# Patient Record
Sex: Male | Born: 1955 | Race: White | Hispanic: No | Marital: Married | Smoking: Never smoker
Health system: Southern US, Community
[De-identification: ages and names within clinical notes are randomized; demographics above are authoritative.]

## PROBLEM LIST (undated history)

## (undated) DIAGNOSIS — T7840XA Allergy, unspecified, initial encounter: Secondary | ICD-10-CM

## (undated) HISTORY — DX: Allergy, unspecified, initial encounter: T78.40XA

---

## 2004-07-28 ENCOUNTER — Ambulatory Visit: Payer: Self-pay | Admitting: Sports Medicine

## 2005-04-02 ENCOUNTER — Ambulatory Visit: Payer: Self-pay | Admitting: Sports Medicine

## 2005-04-16 ENCOUNTER — Ambulatory Visit: Payer: Self-pay | Admitting: Sports Medicine

## 2006-05-31 ENCOUNTER — Ambulatory Visit: Payer: Self-pay | Admitting: Sports Medicine

## 2007-01-18 ENCOUNTER — Ambulatory Visit: Payer: Self-pay | Admitting: Family Medicine

## 2007-01-18 DIAGNOSIS — J309 Allergic rhinitis, unspecified: Secondary | ICD-10-CM | POA: Insufficient documentation

## 2007-01-18 DIAGNOSIS — B009 Herpesviral infection, unspecified: Secondary | ICD-10-CM | POA: Insufficient documentation

## 2007-01-26 ENCOUNTER — Ambulatory Visit: Payer: Self-pay | Admitting: Family Medicine

## 2007-01-27 LAB — CONVERTED CEMR LAB
Cholesterol: 166 mg/dL (ref 0–200)
HDL: 46.8 mg/dL (ref >39.0)
LDL Cholesterol: 106 mg/dL — ABNORMAL HIGH (ref 0–99)
Total CHOL/HDL Ratio: 3.5

## 2007-01-30 ENCOUNTER — Encounter (INDEPENDENT_AMBULATORY_CARE_PROVIDER_SITE_OTHER): Payer: Self-pay | Admitting: Family Medicine

## 2007-01-30 ENCOUNTER — Telehealth (INDEPENDENT_AMBULATORY_CARE_PROVIDER_SITE_OTHER): Payer: Self-pay | Admitting: *Deleted

## 2007-08-16 ENCOUNTER — Ambulatory Visit: Payer: Self-pay | Admitting: Internal Medicine

## 2007-10-18 ENCOUNTER — Telehealth (INDEPENDENT_AMBULATORY_CARE_PROVIDER_SITE_OTHER): Payer: Self-pay | Admitting: *Deleted

## 2008-01-05 ENCOUNTER — Telehealth (INDEPENDENT_AMBULATORY_CARE_PROVIDER_SITE_OTHER): Payer: Self-pay | Admitting: *Deleted

## 2008-01-08 ENCOUNTER — Telehealth (INDEPENDENT_AMBULATORY_CARE_PROVIDER_SITE_OTHER): Payer: Self-pay | Admitting: *Deleted

## 2008-08-30 ENCOUNTER — Telehealth (INDEPENDENT_AMBULATORY_CARE_PROVIDER_SITE_OTHER): Payer: Self-pay | Admitting: *Deleted

## 2008-11-11 ENCOUNTER — Ambulatory Visit: Payer: Self-pay | Admitting: Sports Medicine

## 2009-03-26 ENCOUNTER — Telehealth (INDEPENDENT_AMBULATORY_CARE_PROVIDER_SITE_OTHER): Payer: Self-pay | Admitting: *Deleted

## 2009-04-02 ENCOUNTER — Ambulatory Visit: Payer: Self-pay | Admitting: Internal Medicine

## 2009-04-02 DIAGNOSIS — E785 Hyperlipidemia, unspecified: Secondary | ICD-10-CM | POA: Insufficient documentation

## 2009-04-02 DIAGNOSIS — N400 Enlarged prostate without lower urinary tract symptoms: Secondary | ICD-10-CM

## 2009-04-03 ENCOUNTER — Encounter (INDEPENDENT_AMBULATORY_CARE_PROVIDER_SITE_OTHER): Payer: Self-pay | Admitting: *Deleted

## 2009-04-09 ENCOUNTER — Ambulatory Visit: Payer: Self-pay | Admitting: Internal Medicine

## 2009-04-09 LAB — CONVERTED CEMR LAB
Blood in Urine, dipstick: NEGATIVE
Glucose, Urine, Semiquant: NEGATIVE
Nitrite: NEGATIVE
Protein, U semiquant: NEGATIVE
WBC Urine, dipstick: NEGATIVE
pH: 7.5

## 2009-04-14 ENCOUNTER — Encounter (INDEPENDENT_AMBULATORY_CARE_PROVIDER_SITE_OTHER): Payer: Self-pay | Admitting: *Deleted

## 2009-04-14 LAB — CONVERTED CEMR LAB
ALT: 26 units/L (ref 0–53)
Basophils Relative: 1.1 % (ref 0.0–3.0)
Bilirubin, Direct: 0.1 mg/dL (ref 0.0–0.3)
CO2: 30 meq/L (ref 19–32)
Chloride: 108 meq/L (ref 96–112)
Creatinine, Ser: 0.8 mg/dL (ref 0.4–1.5)
Eosinophils Relative: 3.9 % (ref 0.0–5.0)
Hemoglobin: 14.8 g/dL (ref 13.0–17.0)
Lymphocytes Relative: 37.3 % (ref 12.0–46.0)
MCHC: 34.3 g/dL (ref 30.0–36.0)
Monocytes Relative: 7.7 % (ref 3.0–12.0)
Neutro Abs: 2.2 10*3/uL (ref 1.4–7.7)
RBC: 4.59 M/uL (ref 4.22–5.81)
TSH: 4.44 microintl units/mL (ref 0.35–5.50)
Total CHOL/HDL Ratio: 3
Total Protein: 6.8 g/dL (ref 6.0–8.3)
Triglycerides: 51 mg/dL (ref 0.0–149.0)
WBC: 4.6 10*3/uL (ref 4.5–10.5)

## 2009-05-14 ENCOUNTER — Encounter (INDEPENDENT_AMBULATORY_CARE_PROVIDER_SITE_OTHER): Payer: Self-pay

## 2009-05-20 ENCOUNTER — Ambulatory Visit: Payer: Self-pay | Admitting: Internal Medicine

## 2009-06-06 ENCOUNTER — Ambulatory Visit: Payer: Self-pay | Admitting: Internal Medicine

## 2010-03-08 ENCOUNTER — Ambulatory Visit: Payer: Self-pay | Admitting: Interventional Radiology

## 2010-03-08 ENCOUNTER — Emergency Department (HOSPITAL_BASED_OUTPATIENT_CLINIC_OR_DEPARTMENT_OTHER): Admission: EM | Admit: 2010-03-08 | Discharge: 2010-03-08 | Payer: Self-pay | Admitting: Emergency Medicine

## 2010-04-28 ENCOUNTER — Ambulatory Visit: Payer: Self-pay | Admitting: Family Medicine

## 2010-04-28 ENCOUNTER — Encounter: Payer: Self-pay | Admitting: Family Medicine

## 2010-04-28 ENCOUNTER — Encounter
Admission: RE | Admit: 2010-04-28 | Discharge: 2010-04-28 | Payer: Self-pay | Source: Home / Self Care | Attending: Family Medicine | Admitting: Family Medicine

## 2010-04-28 DIAGNOSIS — S76019A Strain of muscle, fascia and tendon of unspecified hip, initial encounter: Secondary | ICD-10-CM | POA: Insufficient documentation

## 2010-06-16 NOTE — Procedures (Signed)
Summary: Colonoscopy  Patient: Preston Tate Note: All result statuses are Final unless otherwise noted.  Tests: (1) Colonoscopy (COL)   COL Colonoscopy           DONE     [facility]     [address]     [place], [place]  [address]           COLONOSCOPY PROCEDURE REPORT           PATIENT:  Preston Tate, Preston Tate  MR#:  [id number]     BIRTHDATE:  1955/12/17, 53 yrs. old  GENDER:  male           ENDOSCOPIST:  Iva Boop, MD, [facility]     Referred by:  Marga Melnick, M.D.           PROCEDURE DATE:  06/06/2009     PROCEDURE:  Colonoscopy 19[address]     ASA CLASS:  Class I     INDICATIONS:  Routine Risk Screening           MEDICATIONS:   Fentanyl 75 mcg IV, Versed 8 mg IV           DESCRIPTION OF PROCEDURE:   After the risks benefits and     alternatives of the procedure were thoroughly explained, informed     consent was obtained.  Digital rectal exam was performed and     revealed no abnormalities and normal prostate.   The LB CF-H180AL     [id number] endoscope was introduced through the anus and advanced to     the cecum, which was identified by both the appendix and ileocecal     valve, without limitations.  The quality of the prep was     excellent, using MoviPrep.  The instrument was then slowly     withdrawn as the colon was fully examined.     Insertion: 6:08 minutes Withdrawal: 17:58 minutes     <<PROCEDUREIMAGES>>           FINDINGS:  Mild diverticulosis was found in the sigmoid colon.     This was otherwise a normal examination of the colon.   Retroflexed     views in the rectum revealed no abnormalities.    The scope was     then withdrawn from the patient and the procedure completed.           COMPLICATIONS:  None           ENDOSCOPIC IMPRESSION:     1) Mild diverticulosis in the sigmoid colon     2) Otherwise normal examination, excellent prep           REPEAT EXAM:  In 10 year(s) for routine screening colonocsopy.           Iva Boop, MD, [facility]             CC:  Pecola Lawless, MD     The Patient           n.     Rosalie Doctor:   Iva Boop at 06/06/2009 09:13 AM           Alcus Dad, [phone]  Note: An exclamation mark (!) indicates a result that was not dispersed into the flowsheet. Document Creation Date: 06/06/2009 9:13 AM _______________________________________________________________________  (1) Order result status: Final Collection or observation date-time: 06/06/2009 09:05 Requested date-time:  Receipt date-time:  Reported date-time:  Referring Physician:   Ordering Physician: Stan Head [phone]) Specimen Source:  Source:  Launa Grill Order Number: [id number] Lab site:   Appended Document: Colonoscopy    Clinical Lists Changes  Observations: Added new observation of COLONNXTDUE: 05/2019 (06/06/2009 14:34)

## 2010-06-16 NOTE — Miscellaneous (Signed)
Summary: Lec previsit  Clinical Lists Changes  Medications: Added new medication of MOVIPREP 100 GM  SOLR (PEG-KCL-NACL-NASULF-NA ASC-C) As per prep instructions. - Signed Rx of MOVIPREP 100 GM  SOLR (PEG-KCL-NACL-NASULF-NA ASC-C) As per prep instructions.;  #1 x 0;  Signed;  Entered by: Ulis Rias RN;  Authorized by: Iva Boop MD, [facility];  Method used: Electronically to [facility] [phone]*, [address], [place], [place]  [address], Ph: [phone], Fax: [phone] Observations: Added new observation of NKA: T (05/20/2009 8:29)    Prescriptions: MOVIPREP 100 GM  SOLR (PEG-KCL-NACL-NASULF-NA ASC-C) As per prep instructions.  #1 x 0   Entered by:   Ulis Rias RN   Authorized by:   Iva Boop MD, [facility]   Signed by:   Ulis Rias RN on 05/20/2009   Method used:   Electronically to        [facility] [phone]* (retail)       [address]       [place], [place]  [address]       Ph: [phone]       Fax: [phone]   RxID:   [id number]

## 2010-06-18 NOTE — Assessment & Plan Note (Signed)
Summary: ?cracked ribs on rt side/bmc   Vital Signs:  Patient profile:   55 year old male Height:      71.5 inches Weight:      172 pounds Temp:     98 degrees F Pulse rate:   60 / minute Resp:     14 per minute  Vitals Entered By: Hannah Beat MD (April 28, 2010 4:41 PM)  History of Present Illness:  a 56 year old runner presents with right-sided mid thoracic rib pain that occurred after he was working on his truck during Thanksgiving. He felt a pop, and insertional he had some pain intermittently since then. Has been improving over that time.  He is been frustrated in his inability to return back to full running and athletic activity.  REVIEW OF SYSTEMS  GEN: No systemic complaints, no fevers, chills, sweats, or other acute illnesses MSK: Detailed in the HPI GI: tolerating PO intake without difficulty Neuro: No numbness, parasthesias, or tingling associated. Otherwise the pertinent positives of the ROS are noted above.    GEN: Well-developed,well-nourished,in no acute distress; alert,appropriate and cooperative throughout examination HEENT: Normocephalic and atraumatic without obvious abnormalities. No apparent alopecia or balding. Ears, externally no deformities PULM: Breathing comfortably in no respiratory distress EXT: No clubbing, cyanosis, or edema PSYCH: Normally interactive. Cooperative during the interview. Pleasant. Friendly and conversant. Not anxious or depressed appearing. Normal, full affect.   Negative pain with sternal compression. Patient does have pain in the area of approximately the seventh through 10th ribs in the anterolateral aspect.  Allergies: No Known Drug Allergies   Impression & Recommendations:  Problem # 1:  RIB PAIN (ICD-786.50) the fracture versus constant detritus versus muscular skeletal strain.  Obtain full rib x-ray series.  At this point should show callous  Discussed rtp, ok to bike, recumbent, upright, limit based on  pain. Use rib belt Please see the patient instructions for a detailed list of plans and what was discussed with the patient.   Orders: Radiology other (Radiology Other)  Complete Medication List: 1)  Flonase 50 Mcg/act Susp (Fluticasone propionate) .... Use one spray in each nostril twice daily 2)  Valtrex 1 Gm Tabs (Valacyclovir hcl) .... 2 by mouth two times a day for 1 day prn  Patient Instructions: 1)  Ibuprofen 200 mg, 3 tabs by mouth three times a day  2)  Omeprazole 20 mg, 30 minutes  before breakfast 3)  Do this for 2 weeks 4)  GO TO RIB XRAY SERIES 5)  CONTINUE TO WEAR RIB BELT 6)  ADVANCE ACTIVITY AS WE DISCUSSED   Orders Added: 1)  Radiology other [Radiology Other] 2)  Est. Patient Level III [11914]

## 2010-11-16 ENCOUNTER — Other Ambulatory Visit: Payer: Self-pay | Admitting: Internal Medicine

## 2010-12-21 ENCOUNTER — Other Ambulatory Visit: Payer: Self-pay | Admitting: Internal Medicine

## 2010-12-22 NOTE — Telephone Encounter (Signed)
Last OC 04/02/2009, unable to fill any medications at this time, patient will need to schedule an OV (CPX)  I called home number-no voicemail, unable to leave message.  I called work number and left message on voicemail for patient to call the office to schedule OV

## 2012-04-06 ENCOUNTER — Encounter: Payer: Self-pay | Admitting: Internal Medicine

## 2013-02-12 ENCOUNTER — Ambulatory Visit (INDEPENDENT_AMBULATORY_CARE_PROVIDER_SITE_OTHER): Payer: Managed Care, Other (non HMO) | Admitting: Family Medicine

## 2013-02-12 ENCOUNTER — Ambulatory Visit (HOSPITAL_BASED_OUTPATIENT_CLINIC_OR_DEPARTMENT_OTHER)
Admission: RE | Admit: 2013-02-12 | Discharge: 2013-02-12 | Disposition: A | Discharge status: Home / Self Care | Payer: Managed Care, Other (non HMO) | Source: Ambulatory Visit | Attending: Family Medicine | Admitting: Family Medicine

## 2013-02-12 ENCOUNTER — Encounter: Payer: Self-pay | Admitting: Family Medicine

## 2013-02-12 VITALS — BP 128/73 | HR 55 | Ht 70.0 in | Wt 170.0 lb

## 2013-02-12 DIAGNOSIS — M959 Acquired deformity of musculoskeletal system, unspecified: Secondary | ICD-10-CM | POA: Insufficient documentation

## 2013-02-12 DIAGNOSIS — M79604 Pain in right leg: Secondary | ICD-10-CM

## 2013-02-12 DIAGNOSIS — I70209 Unspecified atherosclerosis of native arteries of extremities, unspecified extremity: Secondary | ICD-10-CM | POA: Insufficient documentation

## 2013-02-12 DIAGNOSIS — M79609 Pain in unspecified limb: Secondary | ICD-10-CM

## 2013-02-12 NOTE — Progress Notes (Addendum)
Patient ID: Preston Tate, male   DOB: [birth year], 57 y.o.   MRN: [id number]  PCP: Marga Melnick, MD  Subjective:   HPI: Patient is a 57 y.o. male here for right shin pain.  Patient is an avid runner - 25-30 miles a week. Reports has had pain for over a month proximal right shin. Worsens with running though has been running through this. Iced a little bit. Feels a knot in area of pain. No prior injuries. No left shin pain No prior h/o stress fracture.  Past Medical History  Diagnosis Date  . Allergy     Current Outpatient Prescriptions on File Prior to Visit  Medication Sig Dispense Refill  . fluticasone (FLONASE) 50 MCG/ACT nasal spray 1 spray by Nasal route 2 (two) times daily. Each nostril       . valACYclovir (VALTREX) 1000 MG tablet Take 1,000 mg by mouth 2 (two) times daily. For 1 day.  PRN.        No current facility-administered medications on file prior to visit.    History reviewed. No pertinent past surgical history.  No Known Allergies  History   Social History  . Marital Status: Married    Spouse Name: N/A    Number of Children: N/A  . Years of Education: N/A   Occupational History  . Not on file.   Social History Main Topics  . Smoking status: Never Smoker   . Smokeless tobacco: Not on file  . Alcohol Use: Not on file  . Drug Use: Not on file  . Sexual Activity: Not on file   Other Topics Concern  . Not on file   Social History Narrative  . No narrative on file    Family History  Problem Relation Age of Onset  . Diabetes Father   . Heart attack Father   . Hypertension Father   . Sudden death Neg Hx   . Hyperlipidemia Neg Hx     BP 128/73  Pulse 55  Ht 5\' 10"  (1.778 m)  Wt 170 lb (77.111 kg)  BMI 24.39 kg/m2  Review of Systems: See HPI above.    Objective:  Physical Exam:  Gen: NAD  Right lower leg: No obvious deformity, swelling, bruising. TTP proximal medial tibia with some swelling that is firm in this area. FROM  knee and ankle with 5/5 strength all directions, no pain. + hop test. Negative thompsons.    msk u/s:  Right lower leg - increased cortical thickness in area of pain but no edema.  Small increase in neovascularity focally.  Assessment & Plan:  1. Right tibia pain - present in runner with + hop test.  Concerning for stress fracture.  Radiographs show no evidence of sarcoma.  Suggest prior injury though patient denies one.  Will rest from sports/running and move forward with MRI to further characterize area of pain.  Offered treating for presumptive stress fracture given exam, findings and he would like to confirm with MRI before moving forward with this.  Addendum:  MRI results reviewed and discussed with patient.  No evidence of stress fracture despite area of thickness proximal tibia.  Again denied prior injury.  Given these findings would treat for medial tibial stress syndrome.  Discussed rest, icing, tylenol/nsaids, calf sleeve for compression.  Will come in for sports insoles as well sometime this week.

## 2013-02-12 NOTE — Assessment & Plan Note (Signed)
present in runner with + hop test.  Concerning for stress fracture.  Radiographs show no evidence of sarcoma.  Suggest prior injury though patient denies one.  Will rest from sports/running and move forward with MRI to further characterize area of pain.  Offered treating for presumptive stress fracture given exam, findings and he would like to confirm with MRI before moving forward with this.

## 2013-02-12 NOTE — Patient Instructions (Addendum)
Get x-rays downstairs before you leave today (rule out a sarcoma, could confirm the stress fracture). If these are normal we will do the MRI to confirm stress fracture. No running, cycling, elliptical. Ok for swimming if this is not painful. Tylenol or aleve as needed for pain. Icing 15 minutes at a time 3-4 times a day as needed for pain. Follow up will depend on imaging results.

## 2013-02-23 ENCOUNTER — Ambulatory Visit (INDEPENDENT_AMBULATORY_CARE_PROVIDER_SITE_OTHER): Payer: Managed Care, Other (non HMO) | Admitting: Family Medicine

## 2013-02-23 ENCOUNTER — Encounter: Payer: Self-pay | Admitting: Family Medicine

## 2013-02-23 VITALS — BP 126/79 | HR 58 | Ht 70.0 in | Wt 168.0 lb

## 2013-02-23 DIAGNOSIS — M79604 Pain in right leg: Secondary | ICD-10-CM

## 2013-02-23 DIAGNOSIS — M79609 Pain in unspecified limb: Secondary | ICD-10-CM

## 2013-02-26 ENCOUNTER — Encounter: Payer: Self-pay | Admitting: Family Medicine

## 2013-02-26 NOTE — Assessment & Plan Note (Addendum)
Right tibia pain - MRI negative. Consistent with shin splints. Sports insoles with scaphoid pads provided today. Icing, tylenol, relative rest, nsaids as needed. Calf sleeve. F/u in 6 weeks or as needed.

## 2013-02-26 NOTE — Progress Notes (Signed)
Patient ID: Preston Tate, male   DOB: [birth year], 57 y.o.   MRN: [id number]  PCP: Marga Melnick, MD  Subjective:   HPI: Patient is a 57 y.o. male here for right shin pain.  9/29: Patient is an avid runner - 25-30 miles a week. Reports has had pain for over a month proximal right shin. Worsens with running though has been running through this. Iced a little bit. Feels a knot in area of pain. No prior injuries. No left shin pain No prior h/o stress fracture.  10/10: Patient came in today for sports insoles after dx shin splints.  Past Medical History  Diagnosis Date  . Allergy     Current Outpatient Prescriptions on File Prior to Visit  Medication Sig Dispense Refill  . fluticasone (FLONASE) 50 MCG/ACT nasal spray 1 spray by Nasal route 2 (two) times daily. Each nostril       . valACYclovir (VALTREX) 1000 MG tablet Take 1,000 mg by mouth 2 (two) times daily. For 1 day.  PRN.        No current facility-administered medications on file prior to visit.    History reviewed. No pertinent past surgical history.  No Known Allergies  History   Social History  . Marital Status: Married    Spouse Name: N/A    Number of Children: N/A  . Years of Education: N/A   Occupational History  . Not on file.   Social History Main Topics  . Smoking status: Never Smoker   . Smokeless tobacco: Not on file  . Alcohol Use: Not on file  . Drug Use: Not on file  . Sexual Activity: Not on file   Other Topics Concern  . Not on file   Social History Narrative  . No narrative on file    Family History  Problem Relation Age of Onset  . Diabetes Father   . Heart attack Father   . Hypertension Father   . Sudden death Neg Hx   . Hyperlipidemia Neg Hx     BP 126/79  Pulse 58  Ht 5\' 10"  (1.778 m)  Wt 168 lb (76.204 kg)  BMI 24.11 kg/m2  Review of Systems: See HPI above.    Objective:  Physical Exam:  Gen: NAD Exam not repeated today.  Right lower leg: No obvious  deformity, swelling, bruising. TTP proximal medial tibia with some swelling that is firm in this area. FROM knee and ankle with 5/5 strength all directions, no pain. + hop test. Negative thompsons.    msk u/s:  Right lower leg - increased cortical thickness in area of pain but no edema.  Small increase in neovascularity focally.  Assessment & Plan:  1. Right tibia pain - MRI negative.  Consistent with shin splints.  Sports insoles with scaphoid pads provided today.  Icing, tylenol, relative rest, nsaids as needed.  Calf sleeve.  F/u in 6 weeks or as needed.

## 2014-01-07 ENCOUNTER — Encounter: Payer: Self-pay | Admitting: Internal Medicine

## 2014-04-03 ENCOUNTER — Ambulatory Visit: Payer: Managed Care, Other (non HMO) | Admitting: Sports Medicine

## 2014-04-22 IMAGING — CR DG TIBIA/FIBULA 2V*R*
4 series · 4 of 4 positions shown · non-contrast
Comparison: None.

CLINICAL DATA: Medial lower leg pain

EXAM:
RIGHT TIBIA AND FIBULA - 2 VIEW

[t tib/fib ap right (1 of 2)]
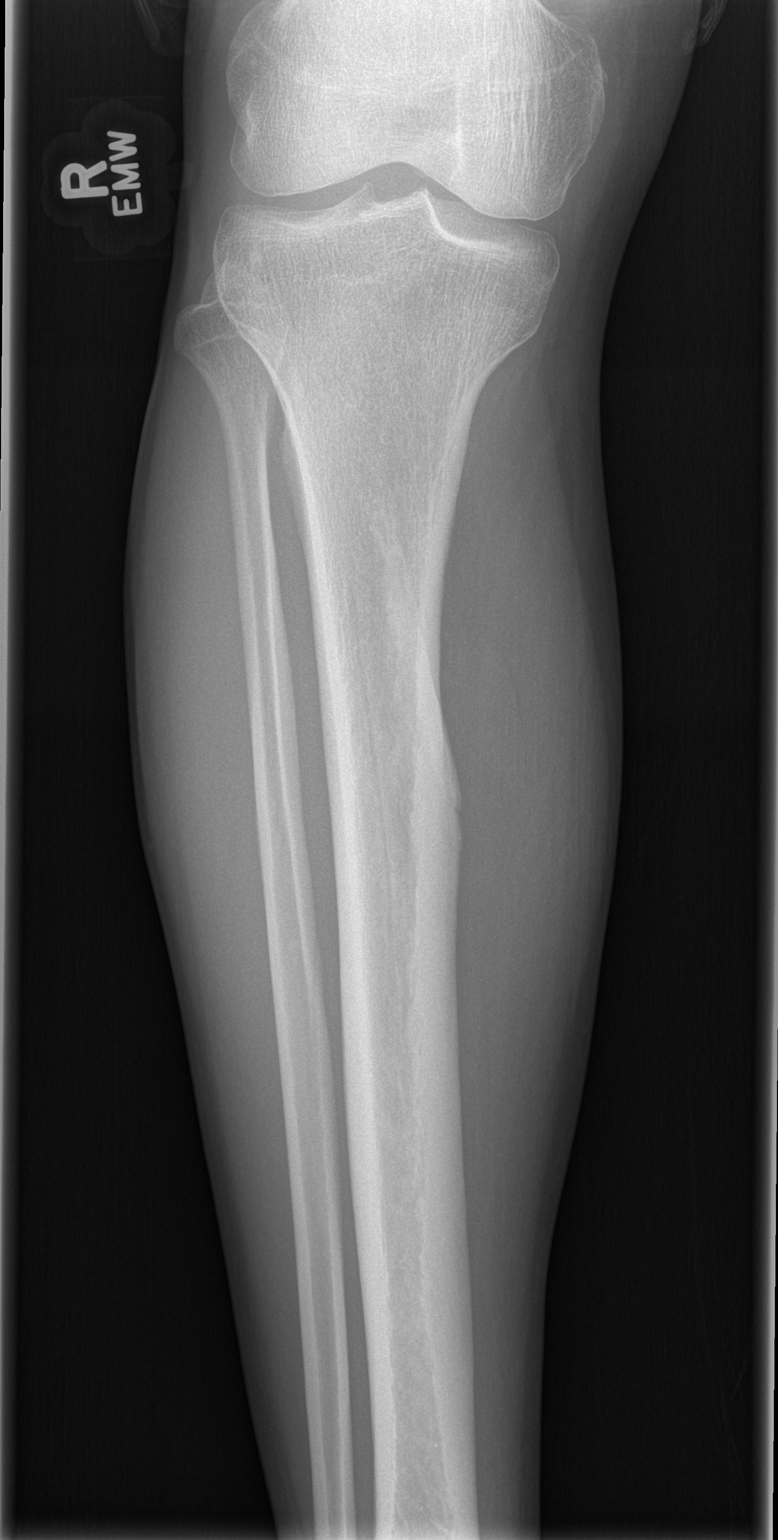

[t tib/fib ap right (2 of 2)]
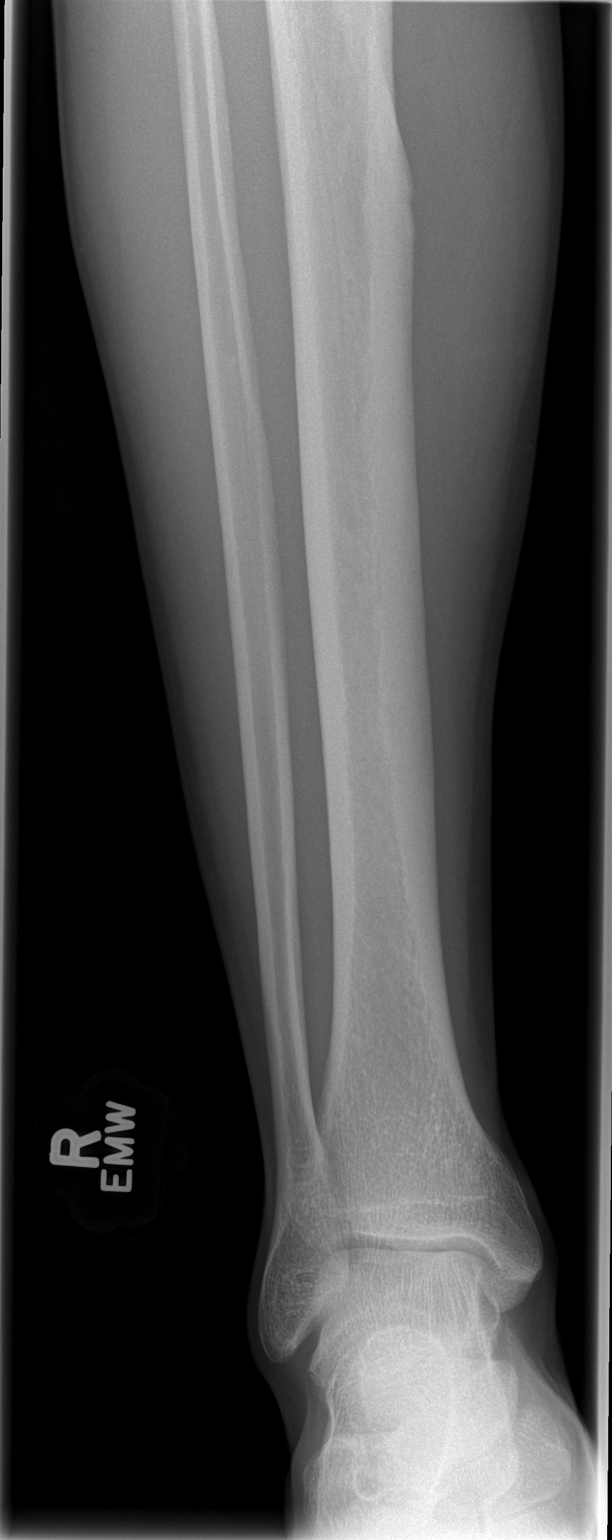

[t tib/fib lat right (1 of 2)]
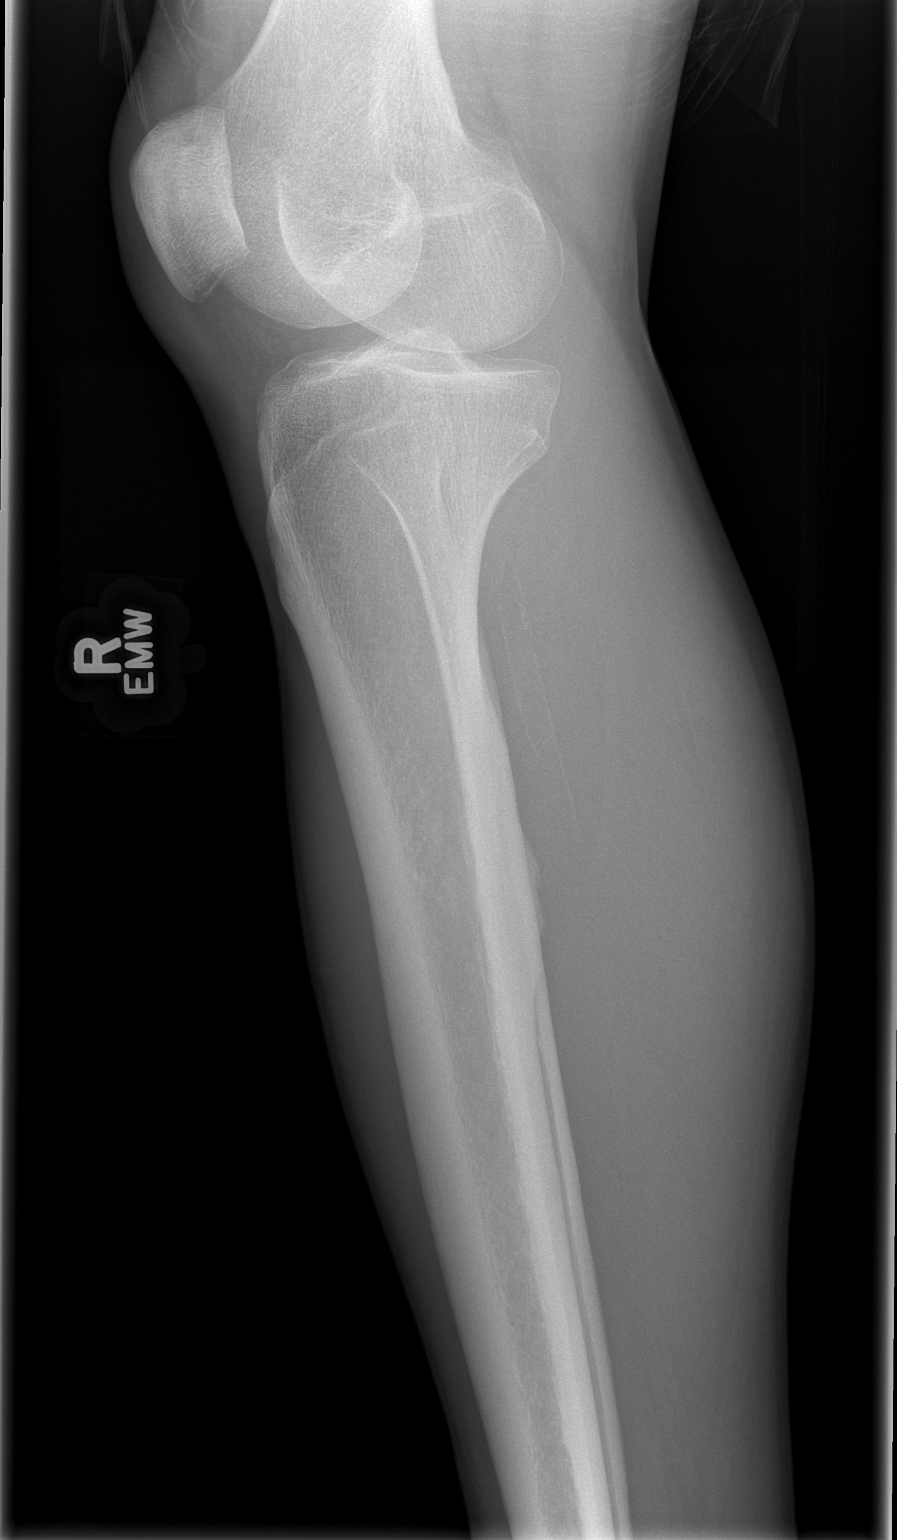

[t tib/fib lat right (2 of 2)]
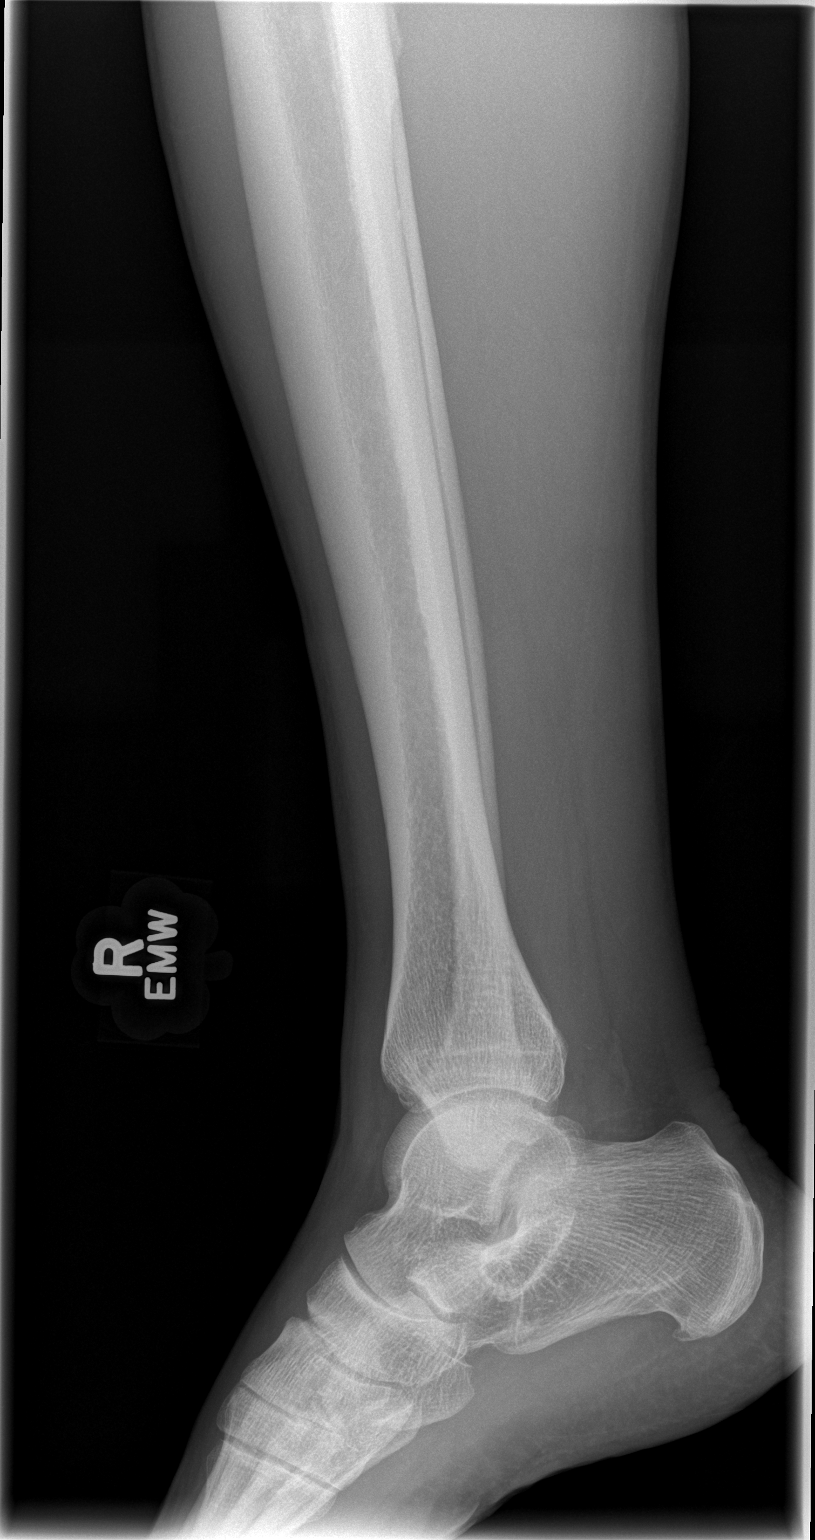

[4 of 4 positions shown; findings below may reference images not displayed]

FINDINGS: Four views of the right tibia-fibula submitted. No acute fracture or
subluxation. There is mild deformity proximal shaft of the right
tibia a probable due to prior injury. Atherosclerotic calcifications
of popliteal artery.
IMPRESSION: No acute fracture or subluxation. Mild deformity proximal shaft of
proximal tibia probable due to prior injury.

## 2014-04-24 ENCOUNTER — Ambulatory Visit (INDEPENDENT_AMBULATORY_CARE_PROVIDER_SITE_OTHER): Payer: Managed Care, Other (non HMO) | Admitting: Sports Medicine

## 2014-04-24 ENCOUNTER — Encounter: Payer: Self-pay | Admitting: Sports Medicine

## 2014-04-24 VITALS — BP 125/81 | HR 56 | Ht 70.0 in | Wt 174.0 lb

## 2014-04-24 DIAGNOSIS — S73192A Other sprain of left hip, initial encounter: Secondary | ICD-10-CM

## 2014-04-24 MED ORDER — MELOXICAM 15 MG PO TABS: 15.0000 mg | ORAL_TABLET | Freq: Every day | ORAL | Status: DC

## 2014-04-24 NOTE — Assessment & Plan Note (Signed)
Start with HEP to work hip abduction, adduction and rotation  Use Mobic Prn  Ice massage  If not improved by 6 weeks consider NTG or CSI

## 2014-04-24 NOTE — Progress Notes (Signed)
Patient ID: Preston Tate, male   DOB: 1956-03-16, 58 y.o.   MRN: [id number]  Patient is a long term runner Pain in left hip usually starts later in 10 mile run Occurs along lateral hip Also some pain along adductor side  No Hx of specific injury No Hx of DJD or stress fractures  Dr Everlene OtherBouska saw and started on Mobic for GTB Much improved sxs  Exam NAD BP 125/81 mmHg  Pulse 56  Ht 5\' 10"  (1.778 m)  Wt 174 lb (78.926 kg)  BMI 24.97 kg/m2  Hip:  ROM is normal with 65 deg hip roatation bilat and norm flexion Strength IR: 5/5, ER: 5/5, Flexion: 5/5, Extension: 5/5, Abduction: 5/5, Adduction: 5/5 Pelvic alignment unremarkable to inspection and palpation. Standing hip rotation and gait without trendelenburg / unsteadiness. . No tenderness over piriformis TTP over distal glut medius and greater trochanter. No SI joint tenderness and normal minimal SI movement.  MSK US of left hip Hypoechoic change starts approx 4 cms proximal to insertion of Glut med tendon onto GT Slight calcification No bursal swelling Other tendons normal

## 2014-09-07 ENCOUNTER — Other Ambulatory Visit: Payer: Self-pay | Admitting: Sports Medicine

## 2014-11-07 ENCOUNTER — Other Ambulatory Visit: Payer: Self-pay | Admitting: Sports Medicine

## 2019-10-23 ENCOUNTER — Ambulatory Visit (INDEPENDENT_AMBULATORY_CARE_PROVIDER_SITE_OTHER): Payer: Managed Care, Other (non HMO) | Admitting: Sports Medicine

## 2019-10-23 ENCOUNTER — Other Ambulatory Visit: Payer: Self-pay

## 2019-10-23 ENCOUNTER — Ambulatory Visit: Payer: Self-pay

## 2019-10-23 ENCOUNTER — Encounter: Payer: Self-pay | Admitting: Sports Medicine

## 2019-10-23 VITALS — BP 108/70 | Ht 70.0 in | Wt 164.0 lb

## 2019-10-23 DIAGNOSIS — M25571 Pain in right ankle and joints of right foot: Secondary | ICD-10-CM

## 2019-10-23 NOTE — Progress Notes (Signed)
   [facility] [address] [place], [place] [address] Phone: [phone] Fax: [phone]   Patient Name: Preston Tate Date of Birth: November 17, 1955 Medical Record Number: [id number] Gender: male Date of Encounter: 10/23/2019  SUBJECTIVE:      Chief Complaint:  Right heel pain   HPI:  Preston Tate is a 64 year old male with lateral right heel pain for the last 2-3 weeks.  He is also complaining of pain at the right great toe and feels a lack of mobility at the joint.  No specific MOI.  He is an avid Geologist, engineering, and had recently increased his running to add an extra 5 miles a week.  He is regularly biking as well.  He notices the pain with running and if he is walking for long periods of time.  No pain with cycling.  He has not injured this heel before.  He has a history of left foot plantar fasciitis.  He denies any swelling, weakness, or numbness in the foot.     ROS:     See HPI.   PERTINENT  PMH / PSH / FH / SH:  Past Medical, Surgical, Social, and Family History Reviewed & Updated in the EMR. Pertinent findings include:  Left plantar fasciitis, right gluteal med pain   OBJECTIVE:  BP 108/70   Ht 5\' 10"  (1.778 m)   Wt 164 lb (74.4 kg)   BMI 23.53 kg/m  Physical Exam:  Vital signs are reviewed.   GEN: Alert and oriented, NAD Pulm: Breathing unlabored PSY: normal mood, congruent affect  MSK: Right foot No swelling or erythema Full ROM at ankle  Decreased ROM in great toe flexion and extension Full strength at ankle and toes Mild TTP at lateral calcaneal body Negative compression test Normal transverse and medial arch NVI  Limited MSK ultrasound Right foot The lateral calcaneus was visualized in long axis with mild hypoechoic changes along cortex of bone without neovascularization or much cortical irregularity.  The great toe joint was visualized in long and short axis without abnormality.  Impression: Right calcaneal stress reaction  and first MTP capsulitis  Ultrasound was performed and interpreted by Dr. and Ruta Hinds.  Fields, MD  Patient was able to run with gel heel cup and right foot without pain  ASSESSMENT & PLAN:   1. Right calcaneal stress reaction  Given the lack of pain with running in the heel cup, it is more likely a stress reaction or soft tissue injury than a true stress fracture.  He will use the heel cup and stop running for the next 2 weeks.  At that time he can gradually increase his running starting at about 50% of his normal rate.  We will plan to see him back in 4 to 6 weeks at which time we can rescan to rule out stress fracture.   Sibyl Parr, DO, [facility] Sports Medicine Fellow  I observed and examined the patient with Dr. Judge Stall and agree with assessment and plan.  Note reviewed and modified by me. Ruta Hinds, MD

## 2019-11-30 ENCOUNTER — Other Ambulatory Visit: Payer: Self-pay

## 2019-11-30 ENCOUNTER — Ambulatory Visit (INDEPENDENT_AMBULATORY_CARE_PROVIDER_SITE_OTHER): Payer: Managed Care, Other (non HMO) | Admitting: Family Medicine

## 2019-11-30 ENCOUNTER — Ambulatory Visit: Payer: Self-pay

## 2019-11-30 ENCOUNTER — Encounter: Payer: Self-pay | Admitting: Family Medicine

## 2019-11-30 VITALS — BP 118/78 | Ht 70.0 in | Wt 165.0 lb

## 2019-11-30 DIAGNOSIS — M79671 Pain in right foot: Secondary | ICD-10-CM

## 2019-11-30 MED ORDER — NITROGLYCERIN 0.2 MG/HR TD PT24: MEDICATED_PATCH | TRANSDERMAL | 1 refills | Status: AC

## 2019-11-30 NOTE — Progress Notes (Signed)
  Preston Tate - 64 y.o. male MRN [id number]  Date of birth: [birth year]    SUBJECTIVE:      Chief Complaint:/ HPI:    Follow-up of lateral right heel pain first noted in mid May.  No specific known injury.  Had slightly increased his distance running about 5 miles a week at the time.  Since he was last seen, he is been crosstraining.  He tried running twice and had pain.  Does not have pain with cycling.   OBJECTIVE: BP 118/78   Ht 5\' 10"  (1.778 m)   Wt 165 lb (74.8 kg)   BMI 23.68 kg/m   Physical Exam:  Vital signs are reviewed. GENERAL: Well-developed male no acute distress next  ANKLES: Full range of motion, normal strength plantar and dorsiflexion.  FOOT: Right.  Slight decreased range of motion right great toe flexion extension.  Lateral calcaneal area is not tender to palpation but this is the location where he points to for the location of his pain.  Medial arches intact.  Slight loss of the transverse arch   ULTRASOUND: Long axis views of the lateral calcaneus reveal noCortical defect, no increased vascular flow seen on Doppler.  Very minor cortical irregularities probably consistent with age.   ASSESSMENT & PLAN:  See problem based charting & AVS for pt instructions. Pain of right heel I compared today's imaging with images from last ultrasound there is essentially no change.  I did not note no hypoechoic changes that were seen at last visit.  Still concern for stress fracture although that would be unusual.  We will get x-rays today in preparation for further work-up.  Discussed options such as continued conservative management versus addition of nitroglycerin patch.  He opted to try the patch.  He will let me know in 2 to 3 weeks if it is improving.  If not I think we need to proceed with MRI.  Continue modified exercise plan with no running unless he becomes pain-free.

## 2019-11-30 NOTE — Assessment & Plan Note (Signed)
I compared today's imaging with images from last ultrasound there is essentially no change.  I did not note no hypoechoic changes that were seen at last visit.  Still concern for stress fracture although that would be unusual.  We will get x-rays today in preparation for further work-up.  Discussed options such as continued conservative management versus addition of nitroglycerin patch.  He opted to try the patch.  He will let me know in 2 to 3 weeks if it is improving.  If not I think we need to proceed with MRI.  Continue modified exercise plan with no running unless he becomes pain-free.

## 2019-11-30 NOTE — Patient Instructions (Signed)
Cut patch into one - fourth pieces Place a one fourth piece of patch on  skin over affected area, changing to a new piece every 24 hours.   You may experience a headache during the first 1-2 days and maybe up to 2 weeks of using the patch; these should improve and go away. If you experience headaches after beginning nitroglycerin patch treatment, you may take your preferred over the counter pain medicine such as tylenol or advil as directed on the box. Another side effect of the nitroglycerin patch can be skin irritation  or rash related to the patch adhesive.   Please notify our office if you develop  severe headaches or rash, and stop the patch.  Please call our office with any questions or problems. Tendon healing with nitroglycerin patch may require 12 to 24 weeks depending on the extent of injury. Men should not use if taking Viagra, Cialis, or Levitra as it may cause an unsafe lowering of the blood pressure..  Use with caution if you have migraines or rosacea.  I have put in for some x rays. I will call you about those. If you are not getting better in a few weeks, the next step is likely MRI of the heel.

## 2019-12-07 ENCOUNTER — Ambulatory Visit
Admission: RE | Admit: 2019-12-07 | Discharge: 2019-12-07 | Disposition: A | Discharge status: Home / Self Care | Payer: Managed Care, Other (non HMO) | Source: Ambulatory Visit | Attending: Family Medicine | Admitting: Family Medicine

## 2019-12-07 ENCOUNTER — Other Ambulatory Visit: Payer: Self-pay | Admitting: Family Medicine

## 2019-12-07 DIAGNOSIS — M79671 Pain in right foot: Secondary | ICD-10-CM

## 2019-12-21 ENCOUNTER — Ambulatory Visit (INDEPENDENT_AMBULATORY_CARE_PROVIDER_SITE_OTHER): Payer: Managed Care, Other (non HMO) | Admitting: Family Medicine

## 2019-12-21 ENCOUNTER — Other Ambulatory Visit: Payer: Self-pay

## 2019-12-21 VITALS — BP 106/74 | Ht 70.0 in | Wt 163.0 lb

## 2019-12-21 DIAGNOSIS — M79671 Pain in right foot: Secondary | ICD-10-CM | POA: Diagnosis not present

## 2019-12-21 NOTE — Progress Notes (Signed)
  Preston Tate - 64 y.o. male MRN [id number]  Date of birth: 12/29/1955    SUBJECTIVE:      Chief Complaint:/ HPI:  Follow-up lateral right foot pain involving the right ankle as well.  He is also started having some increasing pain on the plantar portion of the foot right over the right heel.  He says the more he thinks about this feels like prior stress fracture he had.  He has had no improvement with nitroglycerin patch.  Still has pain with running.  He actually had pain yesterday after walking 2 miles.  Biking is okay without exacerbating pain.    OBJECTIVE: BP 106/74   Ht 5\' 10"  (1.778 m)   Wt 163 lb (73.9 kg)   BMI 23.39 kg/m   Physical Exam:  Vital signs are reviewed. GENERAL: Well-developed male no acute distress Ankles: Full range of motion bilaterally.  No swelling. FOOT: Right.  Very mild tenderness to palpation over the origin of the plantar fascia.  The lateral foot an area where he has other pain is nontender to palpation.  ASSESSMENT & PLAN:  See problem based charting & AVS for pt instructions. Pain of right heel We treated this conservatively now.  X-rays were negative.  Given his increasing symptoms, I think we need to move forward with MRI.  We will contact him after get the results of that.

## 2019-12-21 NOTE — Assessment & Plan Note (Signed)
We treated this conservatively now.  X-rays were negative.  Given his increasing symptoms, I think we need to move forward with MRI.  We will contact him after get the results of that.

## 2019-12-21 NOTE — Patient Instructions (Addendum)
We are going to get an MRI of your right foot. Appt: 12/22/19 @ 10:30 am. Please arrive at 10 am. [facility] [address] [place], [place] [address] [phone]

## 2020-01-02 ENCOUNTER — Telehealth: Payer: Self-pay | Admitting: Family Medicine

## 2020-01-02 ENCOUNTER — Encounter: Payer: Self-pay | Admitting: Family Medicine

## 2020-01-02 NOTE — Progress Notes (Signed)
MRI done through [facility]. report reviewed through Care Everywhere: Bones: No acute fracture or osteonecrosis.  Joints: No joint effusion  Ligaments: Intact.  Tendons: Intact and in anatomic position. Mild fluid distention of the FHL tendon sheath proximal to the sustentaculum.  Achilles tendon: Unremarkable.  Plantar fascia: Intact. Suspect subtle edema marginating the lateral band calcaneal insertion.  Tarsal tunnel and sinus tarsi: Unremarkable.  Other: None.    Impression:   1. Suspect mild plantar fasciitis.  2. Question FHL tenosynovitis, posterior to the ankle.  3. Intact tendons and ligaments.    I left him a message on his phone voice mail. No sign of stress fracture. I will call him again tomorrow.

## 2020-01-02 NOTE — Telephone Encounter (Signed)
Called and left vm about MRI results. I will call him back [facility]

## 2020-01-04 NOTE — Telephone Encounter (Signed)
He spoke with Aundra Millet at [facility] and will schedule appt at his copnvenience. Would consider custom molded orthotics, possible CSI.

## 2020-01-11 ENCOUNTER — Other Ambulatory Visit: Payer: Self-pay

## 2020-01-11 ENCOUNTER — Ambulatory Visit (INDEPENDENT_AMBULATORY_CARE_PROVIDER_SITE_OTHER): Payer: Managed Care, Other (non HMO) | Admitting: Family Medicine

## 2020-01-11 VITALS — BP 118/84 | Ht 70.0 in | Wt 163.0 lb

## 2020-01-11 DIAGNOSIS — M722 Plantar fascial fibromatosis: Secondary | ICD-10-CM | POA: Diagnosis not present

## 2020-01-11 NOTE — Patient Instructions (Signed)
Thank you for coming in to see [place] today! Please see below to review our plan for today's visit:   1.   Please continue icing, stretching, and resting your foot. 2.   Please start incorporating eccentric calf raises as discussed in clinic today. 3.   Please continue to hold off on running until your pain is resolved, then start with very easy running, alternating 1 minute running with 1 minute walking for 10 minutes, and then increasing by 1 to 2 minutes per run.   Please call the clinic at [phone] if your symptoms worsen or you have any concerns. It was our pleasure to serve you.       Dr. Guy Sandifer Dr. Denny Levy [facility]

## 2020-01-11 NOTE — Progress Notes (Signed)
   PCP: Tracey Harries, MD  Subjective:   HPI: Patient is a 64 y.o. male here for reevaluation of right heel pain.  He was most recently seen here on 12/21/2019, at that time he was having pain over his right heel in the setting of recently increasing his running mileage.  He was initially treated conservatively with ice, stretching, cushion shoes with heel cups, and relative rest.  X-ray was obtained did not show any fractures.  There was concern for stress fracture given the strange location over the lateral calcaneus, and follow-up MRI was obtained and showed signs of plantar fasciitis, possible FHL tenosynovitis, and no signs of stress fracture.  Today, patient reports that he has been diligently pursuing conservative treatment with stretching, icing, wearing comfortable shoes, and avoiding running.  He has been cycling without issue.  He reports that he is having slow improvement in his pain.  The pain remains over the lateral aspect of his heel just distal to the lateral malleolus.  He denies any new trauma or injuries.  Review of Systems:  Per HPI.   PMFSH, medications and smoking status reviewed.      Objective:  Physical Exam:  Gen: awake, alert, NAD, comfortable in exam room Pulm: breathing unlabored  R Ankle:  Inspection: No evidence of erythema, ecchymosis, swelling, edema.  Active ROM: Intact to plantar/dorsiflexion, inversion/eversion  Passive ROM: Intact and non-painful to plantar/dorsiflexion,inversion/eversion. No pain with flexion/extension great toe  Strength: 5/5 strength without pain to resisted plantarflexion/dorsiflexion, inversion, eversion  Medial malleolus: Nontender  Lateral malleolus: Mild tenderness distal to lateral malleolus over the lateral aspect of the heel.  Minimal tenderness with palpation of the peroneal tendons Base 5th Metatarsal: Nontender  Navicular: Nontender  ATFL, CFL, PTFL: nontender Mortise/tib-talar joint: nontender Deltoid ligament:  nontender Anterior drawer: No laxity or pain  Talar/reverse talar tilt: No laxity or pain    Assessment & Plan:  1.  Right lateral plantar fasciitis  Patient with lateral heel pain.  MRI obtained since last visit shows some edema over the lateral band of the plantar fascia at its insertion on the calcaneus which is in the area of his pain.  He is having improvement with conservative treatment (rest, ice, stretching), we will plan to continue these for now and also discussed eccentric calf raises.  Would continue to hold from running for now until he has resolution of pain at baseline, then very gradual increase with 1 minute on, 1 minute off for 10 minutes, and then increasing by 1 to 2 minutes per run.  We will plan to follow-up as needed if his symptoms do not continue to improve over the next 4 to 6 weeks.        Guy Sandifer, MD [facility] Fellow 01/11/2020 9:00 AM

## 2020-01-11 NOTE — Progress Notes (Signed)
[facility]: Attending Note: I have reviewed the chart, discussed wit the Sports Medicine Fellow. I agree with assessment and treatment plan as detailed in the Fellow's note. He has gradually imroved some. He discontinued the NTG because he felt it actually made his foot more sensitive / painful.  He will RTC PRN.

## 2020-03-11 ENCOUNTER — Other Ambulatory Visit: Payer: Self-pay

## 2020-03-11 ENCOUNTER — Ambulatory Visit (INDEPENDENT_AMBULATORY_CARE_PROVIDER_SITE_OTHER): Payer: Managed Care, Other (non HMO) | Admitting: Sports Medicine

## 2020-03-11 VITALS — BP 110/84 | Ht 70.0 in | Wt 164.0 lb

## 2020-03-11 DIAGNOSIS — M79671 Pain in right foot: Secondary | ICD-10-CM

## 2020-03-11 NOTE — Patient Instructions (Addendum)
It was great to see you today! Thank you for letting me participate in your care!  Today, we discussed your continued right foot pain. Your MRI was inconclusive as to why you are having pain on the outside of your foot. Because you are not getting better we are sending you to a foot and ankle specialist Dr. Susa Simmonds. Please follow up with [place] after you have been seen by him.  [facility] [address], [place]  [phone]  Appt: Wednesday 03/19/20 @ 9:15 am. Please arrive at 8:30 am OR go to their website and download new patient paperwork to fill out and bring with you to your appt.   For your lower abdominal pain it did not appear or feel like a true hernia. Let pain be your guide and watch out for the symptoms we discussed.  Be well, Jules Schick, DO PGY-4, Sports Medicine Fellow [facility]

## 2020-03-11 NOTE — Progress Notes (Signed)
SUBJECTIVE:   CHIEF COMPLAINT / HPI:   Right Foot Pain F/u [facility] returns to our clinic today for follow-up due to right foot pain.  He states he continues to have pain anytime he begins to increase his running workouts.  He is only able to walk 4 minutes and run 1 minute and he did that today for 2 miles and had extreme pain on the lateral side and bottom of his foot.  It is always in the same spot and only when he continues to run does not move and going to the bottom of his heel.  His MRI was inconclusive as to the source of his pain as it did show some mild plantar fasciitis and some tenosynovitis of the flexor hallux longus.  He is not now and never has been tender on the medial plantar surface at the heel.  Is continuing to cycle and says he tolerates this without any pain whatsoever.  He just wants to get back to running but every time he tries the pain returns.  He has been diligent about home exercises and rehab and for a total of 18 weeks has made little to no progress.  PERTINENT  PMH / PSH: Hyperlipidemia  OBJECTIVE:   BP 110/84   Ht 5\' 10"  (1.778 m)   Wt 164 lb (74.4 kg)   BMI 23.53 kg/m   [facility] Adult [facility] 03/11/2020  Frequency of aerobic exercise (# of days/week) 5  Average time in minutes 45  Frequency of strengthening activities (# of days/week) 1   Ankle/Foot, Right: No visible erythema, swelling, ecchymosis, or bony deformity. TTP at the lateral base of the calcaneous. Range of motion is full in all directions. Strength is 5/5 in all directions. No peroneal tendon tenderness or subluxation; No tenderness on posterior aspects of lateral and medial malleolus; Stable lateral and medial ligaments; Unremarkable squeeze and kleiger tests; No plantar calcaneal tenderness on the medial side; No tenderness over the navicular prominence;  No pain at base of 5th MT; Negative tarsal tunnel tinel's; Able to walk 4 steps.  Special Tests:   - Thompson  Squeeze test: NEG   - Anterior Drawer test: NEG   - Talar Tilt test: NEG   - Syndesmotic Squeeze test: NEG    ASSESSMENT/PLAN:   Pain of right heel He continues to have pain with a inconclusive MRI and not responding to conservative therapy with home exercises, stretches, nitro patches were not well-tolerated, and over-the-counter NSAIDs did not help.  Given that this has not responded to conservative therapy and over 18 weeks and did consider giving an injection today but given that his heel pain is not in the traditional area of the insertion of the plantar fascia and did not feel confident that this would relieve his symptoms.  His pain is in a very atypical location if it were to be due to plantar fascia. - Referral to Dr. 03/13/2020 at [facility] - I am working on getting the actual images of the MRI so I can over read it myself -We will have patient follow-up after he has been seen and evaluated by Dr. [facility] appreciate any input he can have.     Preston Simmonds, DO PGY-4, Sports Medicine Fellow [facility]  Patient seen and evaluated with the sports medicine fellow.  I agree with the above plan of care.  Previous MRI showed some mild edema along the lateral band of the plantar fascia.  This  is an unusual location for plantar fasciitis.  Patient himself says that this feels different than plantar fasciitis that he has had in the past.  He has failed conservative treatment for several weeks.  I would like to refer him to Dr. Susa Tate at [facility] orthopedics for his input.  I will defer further work-up and treatment to the discretion of Dr. Susa Tate.  Patient will follow up with me as needed.

## 2020-03-11 NOTE — Assessment & Plan Note (Signed)
He continues to have pain with a inconclusive MRI and not responding to conservative therapy with home exercises, stretches, nitro patches were not well-tolerated, and over-the-counter NSAIDs did not help.  Given that this has not responded to conservative therapy and over 18 weeks and did consider giving an injection today but given that his heel pain is not in the traditional area of the insertion of the plantar fascia and did not feel confident that this would relieve his symptoms.  His pain is in a very atypical location if it were to be due to plantar fascia. - Referral to Dr. Susa Simmonds at [facility] - I am working on getting the actual images of the MRI so I can over read it myself -We will have patient follow-up after he has been seen and evaluated by Dr. Susa Simmonds appreciate any input he can have.

## 2021-02-13 IMAGING — CR DG OS CALCIS 2+V*R*
3 series · 3 of 3 positions shown · non-contrast
Comparison: None.

CLINICAL DATA: Right heel pain

EXAM:
RIGHT OS CALCIS - 2+ VIEW

[t calcaneus axial right (1 of 2)]
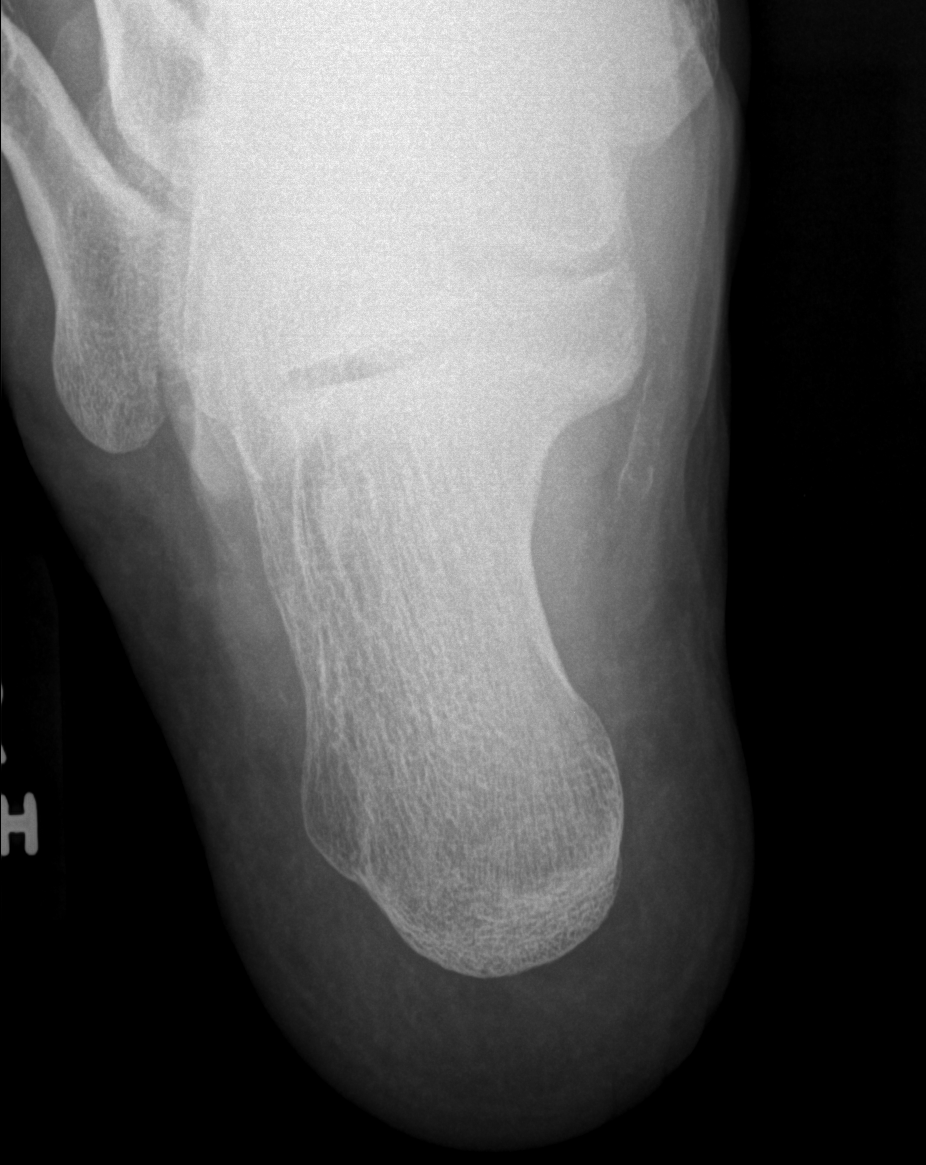

[t calcaneus axial right (2 of 2)]
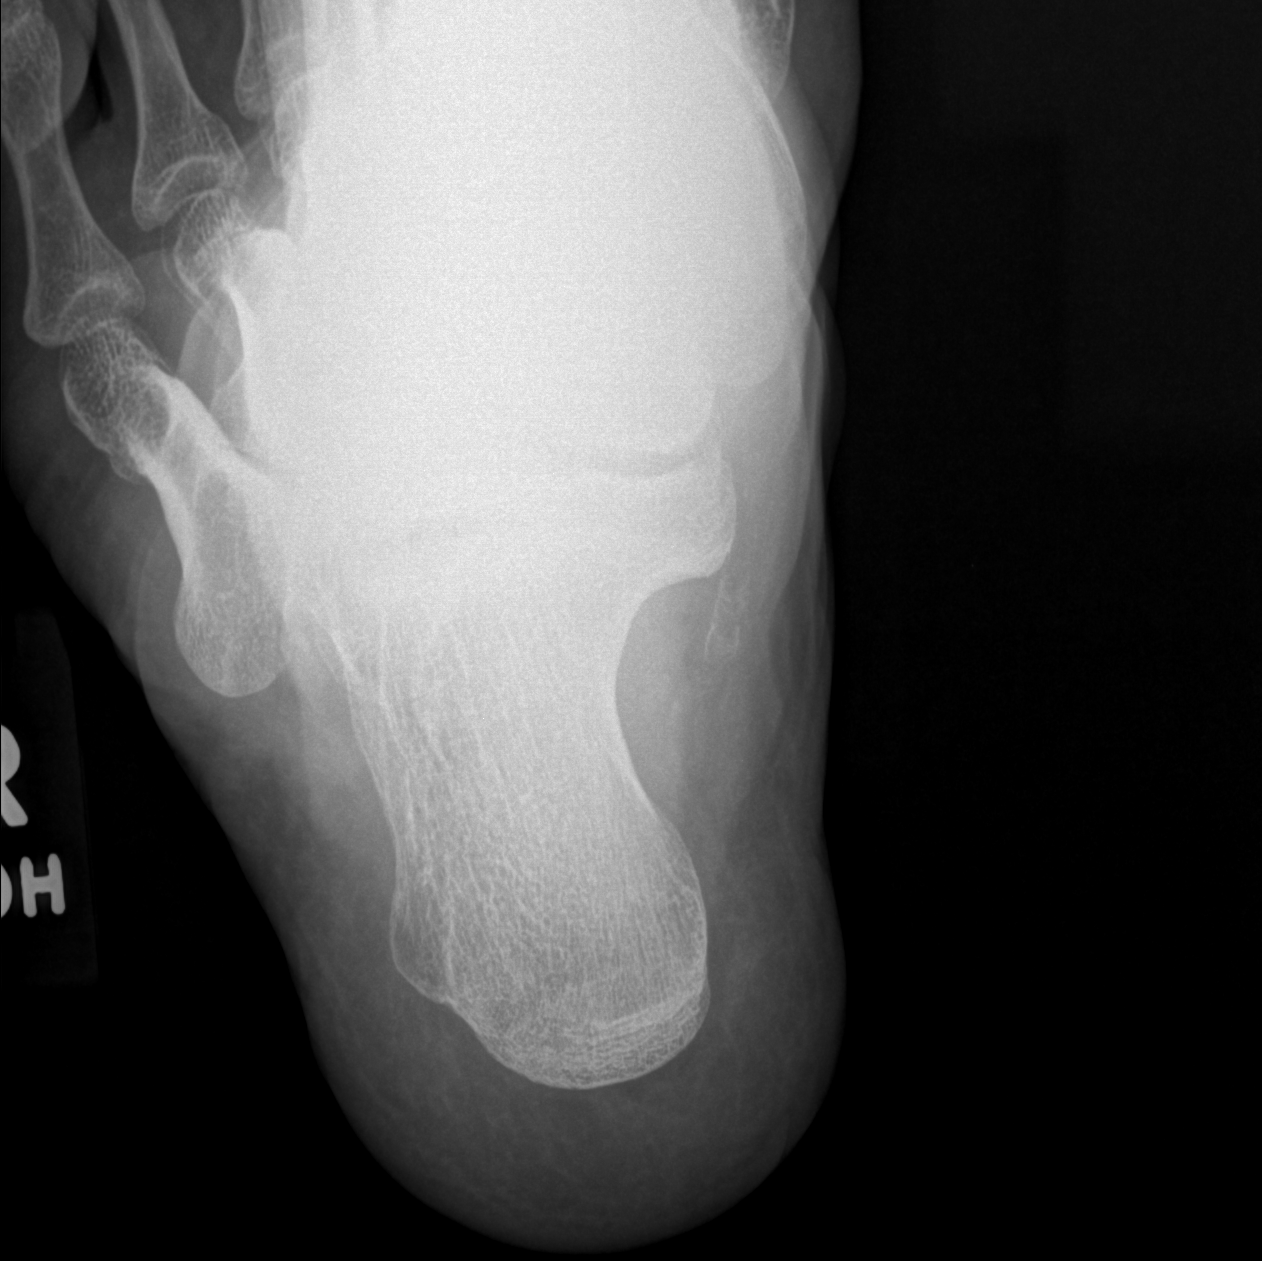

[t calcaneus lat right]
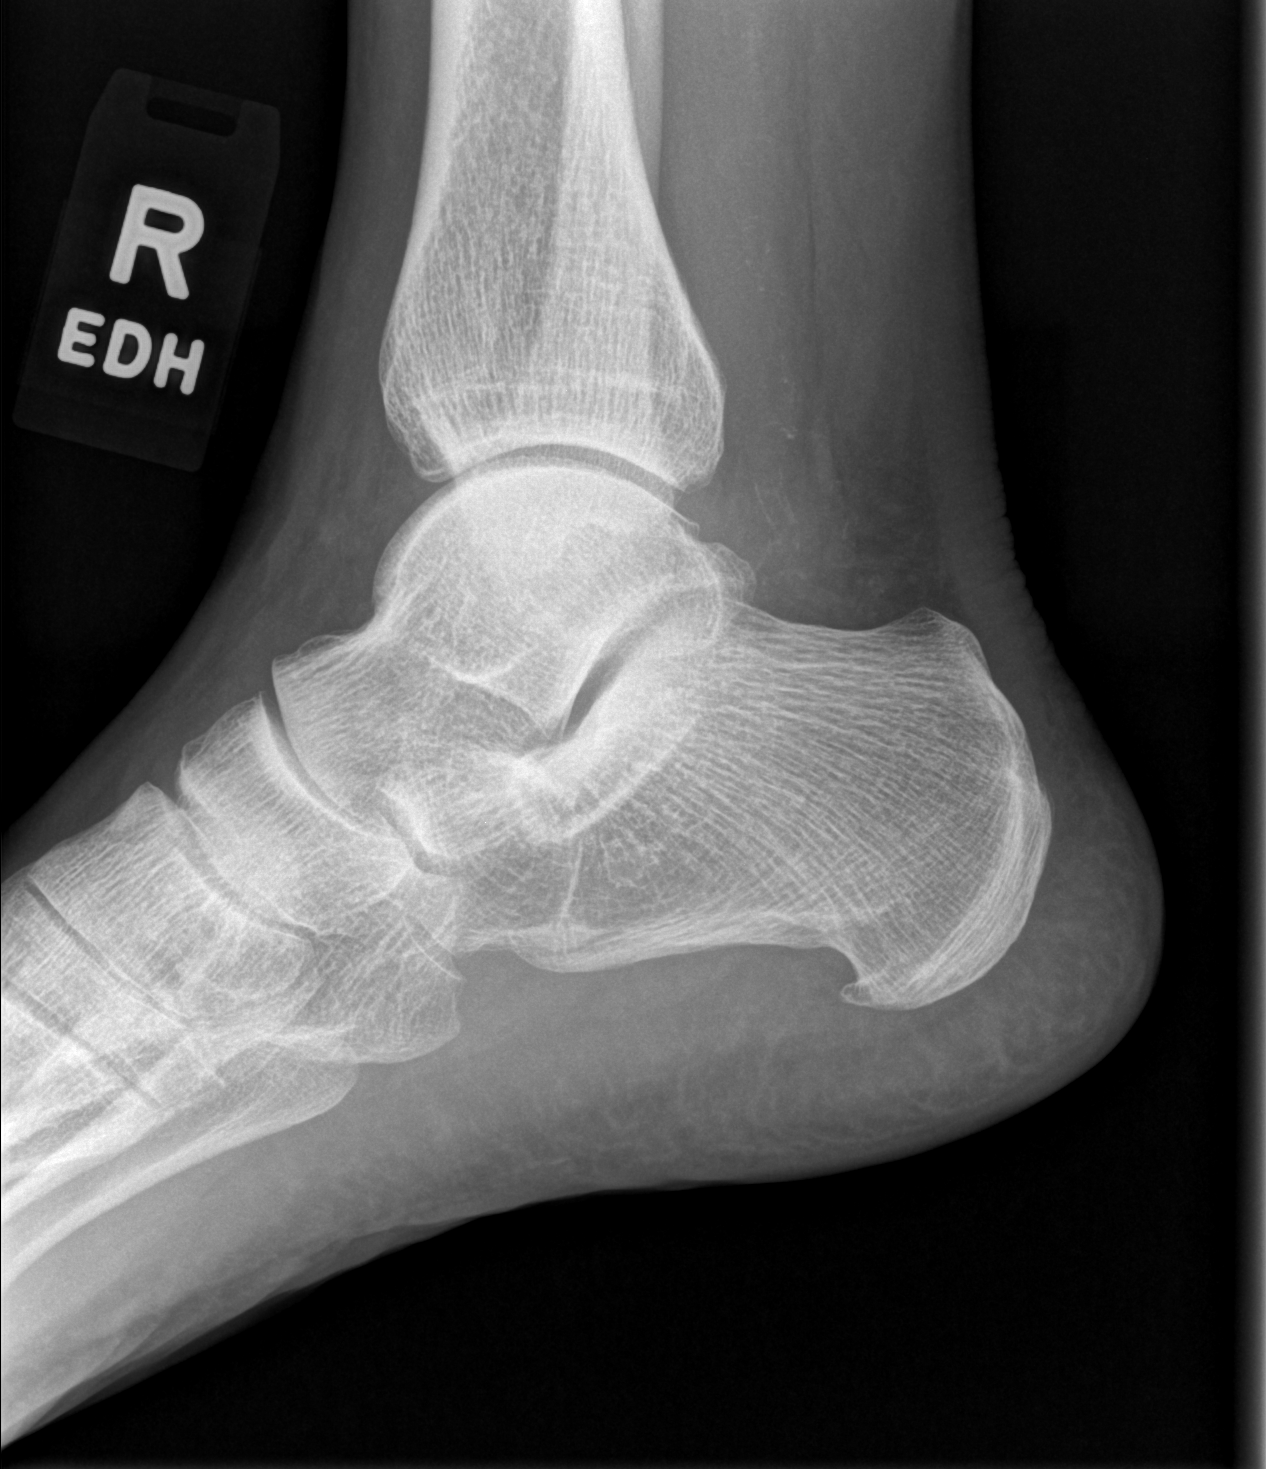

[3 of 3 positions shown; findings below may reference images not displayed]

FINDINGS: Normal alignment. No fracture or dislocation. No destructive osseous
lesion. Tiny plantar calcaneal spur. Vascular calcifications are
seen within the soft tissues.
IMPRESSION: No fracture or destructive osseous lesion.
